# Patient Record
Sex: Male | Born: 1970 | Race: White | Hispanic: No | Marital: Married | Smoking: Never smoker
Health system: Southern US, Community
[De-identification: ages and names within clinical notes are randomized; demographics above are authoritative.]

## PROBLEM LIST (undated history)

## (undated) DIAGNOSIS — K219 Gastro-esophageal reflux disease without esophagitis: Secondary | ICD-10-CM

## (undated) DIAGNOSIS — J329 Chronic sinusitis, unspecified: Secondary | ICD-10-CM

## (undated) DIAGNOSIS — T7840XA Allergy, unspecified, initial encounter: Secondary | ICD-10-CM

## (undated) DIAGNOSIS — I10 Essential (primary) hypertension: Secondary | ICD-10-CM

## (undated) HISTORY — PX: COLONOSCOPY: SHX174

## (undated) HISTORY — DX: Essential (primary) hypertension: I10

## (undated) HISTORY — PX: CLAVICLE SURGERY: SHX598

## (undated) HISTORY — DX: Gastro-esophageal reflux disease without esophagitis: K21.9

## (undated) HISTORY — PX: UPPER GASTROINTESTINAL ENDOSCOPY: SHX188

## (undated) HISTORY — PX: BICEPS TENDON REPAIR: SHX566

## (undated) HISTORY — PX: FEMUR FRACTURE SURGERY: SHX633

## (undated) HISTORY — PX: MOUTH SURGERY: SHX715

## (undated) HISTORY — DX: Allergy, unspecified, initial encounter: T78.40XA

## (undated) HISTORY — PX: CARPAL TUNNEL RELEASE: SHX101

## (undated) HISTORY — PX: FOREARM FRACTURE SURGERY: SHX649

## (undated) HISTORY — DX: Chronic sinusitis, unspecified: J32.9

---

## 2003-08-18 ENCOUNTER — Ambulatory Visit (HOSPITAL_BASED_OUTPATIENT_CLINIC_OR_DEPARTMENT_OTHER): Admission: RE | Admit: 2003-08-18 | Discharge: 2003-08-18 | Payer: Self-pay | Admitting: Orthopedic Surgery

## 2014-02-01 ENCOUNTER — Institutional Professional Consult (permissible substitution): Payer: Self-pay | Admitting: Internal Medicine

## 2014-02-02 ENCOUNTER — Institutional Professional Consult (permissible substitution): Payer: Self-pay | Admitting: Internal Medicine

## 2014-03-15 ENCOUNTER — Institutional Professional Consult (permissible substitution): Payer: Self-pay | Admitting: Critical Care Medicine

## 2014-03-22 ENCOUNTER — Institutional Professional Consult (permissible substitution) (INDEPENDENT_AMBULATORY_CARE_PROVIDER_SITE_OTHER): Payer: Self-pay | Admitting: Critical Care Medicine

## 2014-03-22 DIAGNOSIS — R05 Cough: Secondary | ICD-10-CM

## 2019-11-12 ENCOUNTER — Encounter: Payer: Self-pay | Admitting: Gastroenterology

## 2019-11-15 DIAGNOSIS — I10 Essential (primary) hypertension: Secondary | ICD-10-CM | POA: Insufficient documentation

## 2019-11-15 DIAGNOSIS — E6609 Other obesity due to excess calories: Secondary | ICD-10-CM | POA: Insufficient documentation

## 2019-11-19 DIAGNOSIS — I10 Essential (primary) hypertension: Secondary | ICD-10-CM

## 2020-01-03 ENCOUNTER — Ambulatory Visit: Payer: Managed Care, Other (non HMO) | Admitting: Gastroenterology

## 2020-01-03 ENCOUNTER — Encounter: Payer: Self-pay | Admitting: Gastroenterology

## 2020-01-03 VITALS — BP 124/72 | HR 95 | Ht 68.0 in | Wt 205.0 lb

## 2020-01-03 DIAGNOSIS — R05 Cough: Secondary | ICD-10-CM | POA: Diagnosis not present

## 2020-01-03 DIAGNOSIS — Z1211 Encounter for screening for malignant neoplasm of colon: Secondary | ICD-10-CM | POA: Diagnosis not present

## 2020-01-03 DIAGNOSIS — R053 Chronic cough: Secondary | ICD-10-CM

## 2020-01-03 MED ORDER — PLENVU 140 G PO SOLR
ORAL | 0 refills | Status: DC
Start: 2020-01-03 — End: 2020-01-12
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
## 2020-01-03 NOTE — Patient Instructions (Addendum)
If you are age 49 or older, your body mass index should be between 23-30. Your Body mass index is 31.17 kg/m. If this is out of the aforementioned range listed, please consider follow up with your Primary Care Provider.  If you are age 73 or younger, your body mass index should be between 19-25. Your Body mass index is 31.17 kg/m. If this is out of the aformentioned range listed, please consider follow up with your Primary Care Provider.   You have been scheduled for an endoscopy and colonoscopy. Please follow the written instructions given to you at your visit today. Please pick up your prep supplies at the pharmacy within the next 1-3 days. If you use inhalers (even only as needed), please bring them with you on the day of your procedure.  It was a pleasure to see you today!  Dr. Loletha Carrow

## 2020-01-10 ENCOUNTER — Ambulatory Visit (INDEPENDENT_AMBULATORY_CARE_PROVIDER_SITE_OTHER): Payer: Managed Care, Other (non HMO)

## 2020-01-10 ENCOUNTER — Other Ambulatory Visit: Payer: Self-pay | Admitting: Gastroenterology

## 2020-01-10 ENCOUNTER — Other Ambulatory Visit: Payer: Self-pay

## 2020-01-10 DIAGNOSIS — Z1159 Encounter for screening for other viral diseases: Secondary | ICD-10-CM

## 2020-01-11 LAB — SARS CORONAVIRUS 2 (TAT 6-24 HRS): SARS Coronavirus 2: NEGATIVE

## 2020-01-12 ENCOUNTER — Other Ambulatory Visit: Payer: Self-pay

## 2020-01-12 ENCOUNTER — Ambulatory Visit (AMBULATORY_SURGERY_CENTER): Payer: Managed Care, Other (non HMO) | Admitting: Gastroenterology

## 2020-01-12 ENCOUNTER — Encounter: Payer: Self-pay | Admitting: Gastroenterology

## 2020-01-12 VITALS — BP 124/87 | HR 118 | Temp 97.5°F | Resp 16 | Ht 68.0 in | Wt 205.0 lb

## 2020-01-12 DIAGNOSIS — R05 Cough: Secondary | ICD-10-CM | POA: Diagnosis present

## 2020-01-12 DIAGNOSIS — K2100 Gastro-esophageal reflux disease with esophagitis, without bleeding: Secondary | ICD-10-CM

## 2020-01-12 DIAGNOSIS — R053 Chronic cough: Secondary | ICD-10-CM

## 2020-01-12 DIAGNOSIS — Z1211 Encounter for screening for malignant neoplasm of colon: Secondary | ICD-10-CM | POA: Diagnosis not present

## 2020-01-12 DIAGNOSIS — K621 Rectal polyp: Secondary | ICD-10-CM

## 2020-01-12 DIAGNOSIS — D128 Benign neoplasm of rectum: Secondary | ICD-10-CM

## 2020-01-12 MED ORDER — SODIUM CHLORIDE 0.9 % IV SOLN
500.0000 mL | Freq: Once | INTRAVENOUS | Status: DC
Start: 2020-01-12 — End: 2020-01-12

## 2020-01-12 NOTE — Progress Notes (Signed)
HR > 100 with esmolol 25 mg given IV, MD updated, vss  Report given to PACU, vss

## 2020-01-12 NOTE — Progress Notes (Signed)
VS-CW 

## 2020-01-12 NOTE — Patient Instructions (Signed)
Information on hiatal hernia and Esophagitis given to you today  An upper GI series will be scheduled by Dr Loletha Carrow' office  An Esophageal Manometry will be scheduled by Dr Loletha Carrow' office  Handout on polyps given to you today  Await pathology results on polyps removed   YOU HAD AN ENDOSCOPIC PROCEDURE TODAY AT [facility]:   Refer to the procedure report that was given to you for any specific questions about what was found during the examination.  If the procedure report does not answer your questions, please call your gastroenterologist to clarify.  If you requested that your care partner not be given the details of your procedure findings, then the procedure report has been included in a sealed envelope for you to review at your convenience later.  YOU SHOULD EXPECT: Some feelings of bloating in the abdomen. Passage of more gas than usual.  Walking can help get rid of the air that was put into your GI tract during the procedure and reduce the bloating. If you had a lower endoscopy (such as a colonoscopy or flexible sigmoidoscopy) you may notice spotting of blood in your stool or on the toilet paper. If you underwent a bowel prep for your procedure, you may not have a normal bowel movement for a few days.  Please Note:  You might notice some irritation and congestion in your nose or some drainage.  This is from the oxygen used during your procedure.  There is no need for concern and it should clear up in a day or so.  SYMPTOMS TO REPORT IMMEDIATELY:   Following lower endoscopy (colonoscopy or flexible sigmoidoscopy):  Excessive amounts of blood in the stool  Significant tenderness or worsening of abdominal pains  Swelling of the abdomen that is new, acute  Fever of 100F or higher   Following upper endoscopy (EGD)  Vomiting of blood or coffee ground material  New chest pain or pain under the shoulder blades  Painful or persistently difficult swallowing  New shortness  of breath  Fever of 100F or higher  Black, tarry-looking stools  For urgent or emergent issues, a gastroenterologist can be reached at any hour by calling [phone]. Do not use MyChart messaging for urgent concerns.    DIET:  We do recommend a small meal at first, but then you may proceed to your regular diet.  Drink plenty of fluids but you should avoid alcoholic beverages for 24 hours.  ACTIVITY:  You should plan to take it easy for the rest of today and you should NOT DRIVE or use heavy machinery until tomorrow (because of the sedation medicines used during the test).    FOLLOW UP: Our staff will call the number listed on your records 48-72 hours following your procedure to check on you and address any questions or concerns that you may have regarding the information given to you following your procedure. If we do not reach you, we will leave a message.  We will attempt to reach you two times.  During this call, we will ask if you have developed any symptoms of COVID 19. If you develop any symptoms (ie: fever, flu-like symptoms, shortness of breath, cough etc.) before then, please call [phone].  If you test positive for Covid 19 in the 2 weeks post procedure, please call and report this information to [place].    If any biopsies were taken you will be contacted by phone or by letter within the next 1-3 weeks.  Please call us at [phone] if you have not heard about the biopsies in 3 weeks.    SIGNATURES/CONFIDENTIALITY: You and/or your care partner have signed paperwork which will be entered into your electronic medical record.  These signatures attest to the fact that that the information above on your After Visit Summary has been reviewed and is understood.  Full responsibility of the confidentiality of this discharge information lies with you and/or your care-partner.

## 2020-01-12 NOTE — Progress Notes (Signed)
Called to room to assist during endoscopic procedure.  Patient ID and intended procedure confirmed with present staff. Received instructions for my participation in the procedure from the performing physician.  

## 2020-01-12 NOTE — Op Note (Signed)
[facility] Patient Name: Haston Casebolt Procedure Date: 01/12/2020 2:45 PM MRN: [id number] Endoscopist: Mallie Mussel L. Loletha Carrow , MD Age: 49 Referring MD:  Date of Birth: 1970/06/22 Gender: Male Account #: [id number] Procedure:                Upper GI endoscopy Indications:              Chronic cough, Regurgitation (years, prior EGD                            outside clinic, no improvement with prior PPI) - ?                            GERD as cause of cough Medicines:                Monitored Anesthesia Care Procedure:                Pre-Anesthesia Assessment:                           - Prior to the procedure, a History and Physical                            was performed, and patient medications and                            allergies were reviewed. The patient's tolerance of                            previous anesthesia was also reviewed. The risks                            and benefits of the procedure and the sedation                            options and risks were discussed with the patient.                            All questions were answered, and informed consent                            was obtained. Prior Anticoagulants: The patient has                            taken no previous anticoagulant or antiplatelet                            agents. ASA Grade Assessment: II - A patient with                            mild systemic disease. After reviewing the risks                            and benefits, the patient was deemed in  satisfactory condition to undergo the procedure.                           After obtaining informed consent, the endoscope was                            passed under direct vision. Throughout the                            procedure, the patient's blood pressure, pulse, and                            oxygen saturations were monitored continuously. The                            Endoscope was introduced through  the mouth, and                            advanced to the second part of duodenum. The upper                            GI endoscopy was accomplished without difficulty.                            The patient tolerated the procedure with coughing                            during and after. Scope In: Scope Out: Findings:                 The larynx was normal.                           A 4 cm hiatal hernia was present. (4cm axial, 3cm                            transverse)                           LA Grade B (one or more mucosal breaks greater than                            5 mm, not extending between the tops of two mucosal                            folds) esophagitis was found at the                            gastroesophageal junction. The distal esophagus was                            tortuous as a result of the hiatla hernia.                           During esophageal examination, while  cough and                            belch occured, the proximal stomach was seen                            retrolapsing into the distal esophagus.                           The stomach was normal.                           The cardia and gastric fundus were normal on                            retroflexion.                           The examined duodenum was normal. Complications:            No immediate complications. Estimated Blood Loss:     Estimated blood loss: none. Impression:               - Normal larynx.                           - 4 cm hiatal hernia.                           - LA Grade B esophagitis.                           - Normal stomach.                           - Normal examined duodenum.                           - No specimens collected.                           Endoscopic evidence of GERD with hiatal hernia most                            likely leading to cough reflex. 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 Recommendation:           - Patient has a contact number available for                            emergencies. The signs and symptoms of potential  delayed complications were discussed with the                            patient. Return to normal activities tomorrow.                            Written discharge instructions were provided to the                            patient.                           - Resume previous diet.                           - Continue present medications.                           - Do an upper GI series at appointment to be                            scheduled.                           - Perform routine esophageal manometry at                            appointment to be scheduled. Gerardine Peltz L. Loletha Carrow, MD 01/12/2020 3:44:38 PM This report has been signed electronically.

## 2020-01-12 NOTE — Progress Notes (Signed)
Robinul 0.1 mg IV given due large amount of secretions upon assessment.  MD made aware, vss 

## 2020-01-12 NOTE — Op Note (Signed)
[facility] Patient Name: Jesse Tyler Procedure Date: 01/12/2020 2:46 PM MRN: [id number] Endoscopist: [place]. Loletha Carrow , MD Age: 49 Referring MD:  Date of Birth: 01-19-1971 Gender: Male Account #: [id number] Procedure:                Colonoscopy Indications:              Screening for colorectal malignant neoplasm, This                            is the patient's first colonoscopy Medicines:                Monitored Anesthesia Care Procedure:                Pre-Anesthesia Assessment:                           - Prior to the procedure, a History and Physical                            was performed, and patient medications and                            allergies were reviewed. The patient's tolerance of                            previous anesthesia was also reviewed. The risks                            and benefits of the procedure and the sedation                            options and risks were discussed with the patient.                            All questions were answered, and informed consent                            was obtained. Prior Anticoagulants: The patient has                            taken no previous anticoagulant or antiplatelet                            agents. ASA Grade Assessment: II - A patient with                            mild systemic disease. After reviewing the risks                            and benefits, the patient was deemed in                            satisfactory condition to undergo the procedure.  After obtaining informed consent, the colonoscope                            was passed under direct vision. Throughout the                            procedure, the patient's blood pressure, pulse, and                            oxygen saturations were monitored continuously. The                            Colonoscope was introduced through the anus and                            advanced to the the cecum,  identified by                            appendiceal orifice and ileocecal valve. The                            colonoscopy was performed without difficulty. The                            patient tolerated the procedure well. The quality                            of the bowel preparation was excellent. The                            ileocecal valve, appendiceal orifice, and rectum                            were photographed. The bowel preparation used was                            Plenvu. Scope In: 2:55:16 PM Scope Out: 3:12:56 PM Scope Withdrawal Time: 0 hours 14 minutes 11 seconds  Total Procedure Duration: 0 hours 17 minutes 40 seconds  Findings:                 The perianal and digital rectal examinations were                            normal.                           Three sessile polyps were found in the rectum. The                            polyps were diminutive in size. These polyps were                            removed with a cold biopsy forceps. Resection and  retrieval were complete.                           The exam was otherwise without abnormality on                            direct and retroflexion views. Complications:            No immediate complications. Estimated Blood Loss:     Estimated blood loss was minimal. Impression:               - Three diminutive polyps in the rectum, removed                            with a cold biopsy forceps. Resected and retrieved.                           - The examination was otherwise normal on direct                            and retroflexion views. Recommendation:           - Patient has a contact number available for                            emergencies. The signs and symptoms of potential                            delayed complications were discussed with the                            patient. Return to normal activities tomorrow.                            Written discharge  instructions were provided to the                            patient.                           - Resume previous diet.                           - Continue present medications.                           - Await pathology results.                           - Repeat colonoscopy is recommended for                            surveillance. The colonoscopy date will be                            determined after pathology results from today's  exam become available for review.                           - See the other procedure note for documentation of                            additional recommendations. Balbina Depace L. Loletha Carrow, MD 01/12/2020 3:29:43 PM This report has been signed electronically.

## 2020-01-13 ENCOUNTER — Telehealth: Payer: Self-pay

## 2020-01-13 ENCOUNTER — Other Ambulatory Visit: Payer: Self-pay

## 2020-01-13 DIAGNOSIS — R1310 Dysphagia, unspecified: Secondary | ICD-10-CM

## 2020-01-13 DIAGNOSIS — R05 Cough: Secondary | ICD-10-CM

## 2020-01-13 DIAGNOSIS — R053 Chronic cough: Secondary | ICD-10-CM

## 2020-01-13 NOTE — Telephone Encounter (Signed)
Pt scheduled for Upper GI at [facility] 01/25/20@[facility]  01/25/20@9 :30am, pt to arrive there at 9:15am. Pt to be NPO after midnight. Pt knows we will call him back with the EM appt once scheduled.

## 2020-01-14 ENCOUNTER — Telehealth: Payer: Self-pay

## 2020-01-14 NOTE — Telephone Encounter (Signed)
LVM

## 2020-01-17 ENCOUNTER — Encounter: Payer: Self-pay | Admitting: Gastroenterology

## 2020-01-19 ENCOUNTER — Other Ambulatory Visit: Payer: Self-pay

## 2020-01-19 ENCOUNTER — Telehealth: Payer: Self-pay

## 2020-01-19 DIAGNOSIS — R1310 Dysphagia, unspecified: Secondary | ICD-10-CM

## 2020-01-19 NOTE — Telephone Encounter (Signed)
-----   Message from Algernon Huxley, RN sent at 01/13/2020  4:24 PM EDT ----- Regarding: EM Pt needs EM scheduled-Danis

## 2020-01-19 NOTE — Telephone Encounter (Signed)
Pt scheduled for covid screen 04/04/20@8 :30am, EM scheduled at [facility] 04/16/20@10 :30am. Pt aware of appts and instructions mailed to pt.

## 2020-01-25 ENCOUNTER — Ambulatory Visit (HOSPITAL_COMMUNITY)
Admission: RE | Admit: 2020-01-25 | Discharge: 2020-01-25 | Disposition: A | Discharge status: Home / Self Care | Payer: Managed Care, Other (non HMO) | Source: Ambulatory Visit | Attending: Gastroenterology | Admitting: Gastroenterology

## 2020-01-25 ENCOUNTER — Other Ambulatory Visit: Payer: Self-pay

## 2020-01-25 DIAGNOSIS — R1310 Dysphagia, unspecified: Secondary | ICD-10-CM | POA: Diagnosis present

## 2020-01-25 DIAGNOSIS — R053 Chronic cough: Secondary | ICD-10-CM

## 2020-01-25 DIAGNOSIS — R05 Cough: Secondary | ICD-10-CM | POA: Diagnosis not present

## 2020-04-04 ENCOUNTER — Other Ambulatory Visit (HOSPITAL_COMMUNITY): Payer: Managed Care, Other (non HMO)

## 2020-04-06 ENCOUNTER — Telehealth: Payer: Self-pay | Admitting: Gastroenterology

## 2020-04-06 NOTE — Telephone Encounter (Signed)
Lm on vm for patient to return call.  Esophageal manometry has been rescheduled to next available date which is 05/15/20 at 10:30 AM, arrival time 10 AM.  COVID Test has been rescheduled to 05/11/20 at 9:10 AM.  Will mail letter with update appointment information.

## 2020-04-06 NOTE — Telephone Encounter (Signed)
Pt is requesting to reschedule his EGD scheduled for tomorrow 11/12 at the hospital

## 2020-04-06 NOTE — Telephone Encounter (Signed)
Spoke with patient, he states that he needs a January appt since that is when his insurance will begin.   Rescheduled for 06/13/19 at 8:30 AM, arrival time 8 AM.   COVID test is scheduled for 06/09/19 at 9:30 AM.   Will mail letter. Patient is aware of new appointment information.

## 2020-05-11 ENCOUNTER — Other Ambulatory Visit (HOSPITAL_COMMUNITY): Payer: Managed Care, Other (non HMO)

## 2020-06-08 ENCOUNTER — Other Ambulatory Visit (HOSPITAL_COMMUNITY): Payer: Managed Care, Other (non HMO)

## 2020-06-08 ENCOUNTER — Telehealth: Payer: Self-pay

## 2020-06-08 NOTE — Telephone Encounter (Signed)
Received call from patient, he states that he was unable to make his appointment for his COVID test today for esophogeal manometry that is scheduled for Monday, 06/12/2020. Patient states that he would like to reschedule esophageal manometry as well because that time would not work. Advised that the procedure is only done on certain days and times. Advised that I will reschedule and call him back with the information. Patient verbalized understanding and had no concerns at the end of the call.

## 2020-06-08 NOTE — Telephone Encounter (Signed)
Patient's esophageal manometry has been rescheduled to Wednesday, 07/12/2020 at 12:30 PM. COVID test has been scheduled for Saturday, 07/08/2020 at 11:30 AM. Spoke with patient in regards to new appointment information, pt is aware that I will send updated appointment information in the mail - patient confirmed address on file. Advised that if he does not receive the letter in about 2 weeks or so to give [place] a call back. Patient verbalized understanding of all information and had no concerns at the end of the call.

## 2020-07-08 ENCOUNTER — Other Ambulatory Visit (HOSPITAL_COMMUNITY)
Admission: RE | Admit: 2020-07-08 | Discharge: 2020-07-08 | Disposition: A | Discharge status: Home / Self Care | Payer: BC Managed Care – PPO | Source: Ambulatory Visit | Attending: Gastroenterology | Admitting: Gastroenterology

## 2020-07-08 DIAGNOSIS — U071 COVID-19: Secondary | ICD-10-CM | POA: Diagnosis not present

## 2020-07-08 LAB — SARS CORONAVIRUS 2 (TAT 6-24 HRS): SARS Coronavirus 2: POSITIVE — AB

## 2020-07-09 ENCOUNTER — Telehealth: Payer: Self-pay | Admitting: Gastroenterology

## 2020-07-09 ENCOUNTER — Telehealth: Payer: Self-pay | Admitting: Family

## 2020-07-09 NOTE — Telephone Encounter (Signed)
Received a call from hospital regarding this patients COVID test, which was POSITIVE for manometry scheduled this week. I called the patient and no answer, I left him a message with results. I called his wife's phone, no answered. I will call back later to make sure he received this message.   [place] can you please cancel this patient's manometry test and contact him for rescheduling. Dr. Loletha Carrow patient. Thanks

## 2020-07-09 NOTE — Telephone Encounter (Addendum)
Called to discuss with Jesse Tyler about Covid symptoms and the use of casirivimab/imdevimab, a combination monoclonal antibody infusion for those with mild to moderate Covid symptoms and at a high risk of hospitalization.    Advised my roll and the purpose of my call. Pt does not qualify for infusion therapy as he has asymptomatic infection. He is  states he has no symptoms and that he believes he has a false positive. He advises he had Covid in September and felt bad at that time.     Patient Active Problem List   Diagnosis Date Noted  . Class 1 obesity due to excess calories without serious comorbidity with body mass index (BMI) of 32.0 to 32.9 in adult 11/15/2019  . Essential hypertension 11/15/2019    Lean Fayson,NP

## 2020-07-09 NOTE — Telephone Encounter (Signed)
Patient called back. Verified he received the message. He is asymptomatic. States he had symptomatic COVID in Sept and thinks it is a false positive. Regardless I told him to take precautions / isolation, and that we can call to reschedule his procedure no sooner than 10 days after his positive test.    Herbert Seta can you please contact this patient next week, needs to have manometry rescheduled. Should not need to be retested for COVID for this. Thanks

## 2020-07-10 NOTE — Telephone Encounter (Signed)
Spoke with patient, he wanted to reschedule esophageal manometry for the next available date. He has been scheduled for Wednesday, 07/19/20 at 8:30 AM, arrival time is 8 AM. No repeat COVID test necessary. Patient is aware of the appt, advised that I will mail him a letter with updated appt times, advised that instructions are the same only the date and time of procedure are changing. Patient verbalized understanding of this information and had no concerns at the end of the call.

## 2020-07-18 NOTE — Progress Notes (Signed)
Called patient regarding manometry appointment tomorrow and patient stated he would have to reschedule the appointment. I told him to call dr. Loletha Carrow' office to reschedule.

## 2020-07-19 ENCOUNTER — Encounter (HOSPITAL_COMMUNITY): Admission: RE | Payer: Self-pay | Source: Home / Self Care

## 2020-07-19 ENCOUNTER — Ambulatory Visit (HOSPITAL_COMMUNITY)
Admission: RE | Admit: 2020-07-19 | Payer: BC Managed Care – PPO | Source: Home / Self Care | Admitting: Gastroenterology

## 2020-07-19 SURGERY — MANOMETRY, ESOPHAGUS
Anesthesia: LOCAL

## 2021-01-17 ENCOUNTER — Telehealth: Payer: Self-pay | Admitting: Gastroenterology

## 2021-01-17 NOTE — Telephone Encounter (Signed)
Pt needs to r/s esophageal manometry. Pls call him.

## 2021-01-18 NOTE — Telephone Encounter (Signed)
Spoke with patient, his esophageal manometry has been rescheduled to Wednesday, 02/07/21 at 12:30 pm, arriving at 12 pm. Patient is aware that I will mail the instructions to his home address, pt confirmed address on file. Pt verbalized understanding and had no concerns at the end of the call.  CASE ID: [id number]

## 2021-01-29 IMAGING — RF DG UGI W SINGLE CM
14 of 23 series · 14 of 23 positions shown · non-contrast
Comparison: None.

CLINICAL DATA: Hiatal hernia.  Vomiting.  Chronic cough.

EXAM:
UPPER GI SERIES WITH KUB
TECHNIQUE: After obtaining a scout radiograph a routine upper GI series was
performed using thin and high density barium.
FLUOROSCOPY TIME:  Fluoroscopy Time:  2 minutes 18 seconds
Radiation Exposure Index (if provided by the fluoroscopic device):
61.1 mGy
Number of Acquired Spot Images: 0

[Series 1: t abdomen supine · 0.15mm/px · 1 of 1 slices shown]
[im 1/1]
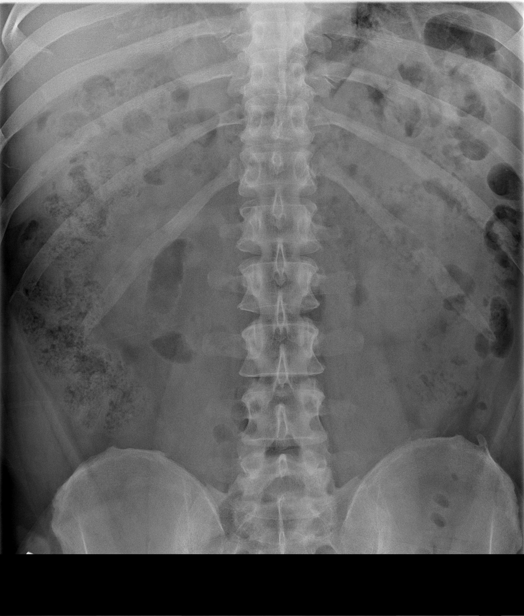

[Series 3: cp_standard · 0.26mm/px · 1 of 1 slices shown (1 of 13)]
[im 1/1]
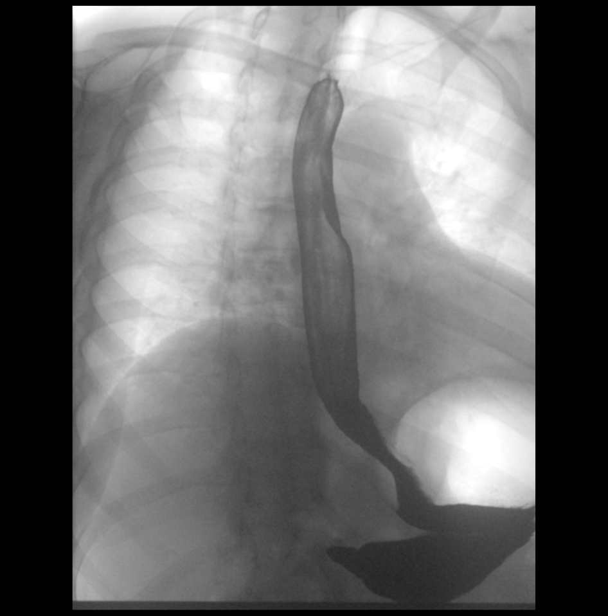

[Series 5: cp_standard · 0.26mm/px · 1 of 1 slices shown (2 of 13)]
[im 1/1]
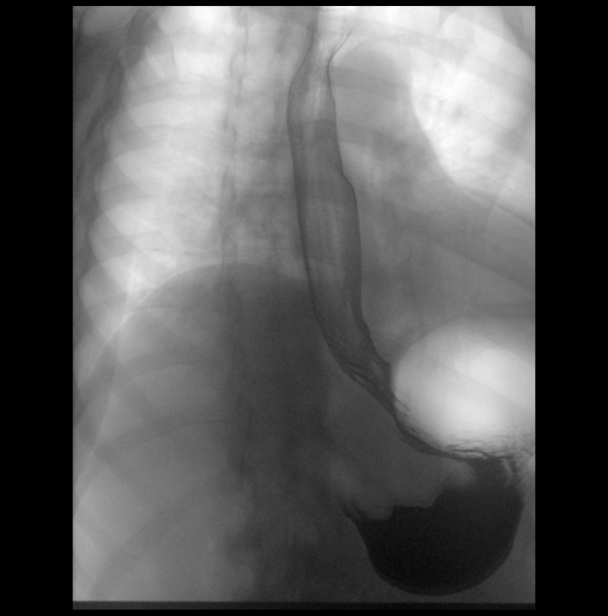

[Series 6: cp_standard · 0.18mm/px · 1 of 1 slices shown (3 of 13)]
[im 1/1]
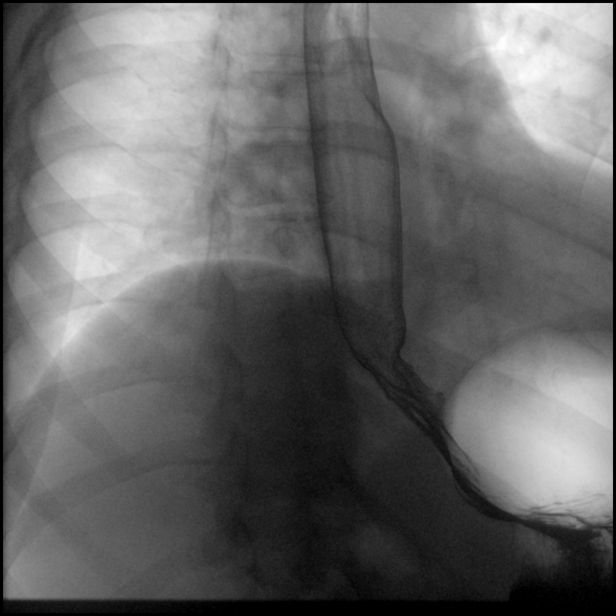

[Series 8: cp_standard · 0.18mm/px · 1 of 1 slices shown (4 of 13)]
[im 1/1]
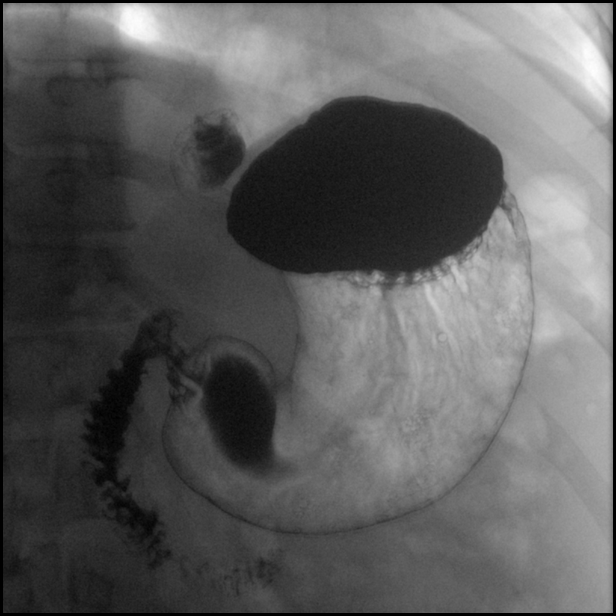

[Series 10: cp_standard · 0.18mm/px · 1 of 1 slices shown (5 of 13)]
[im 1/1]
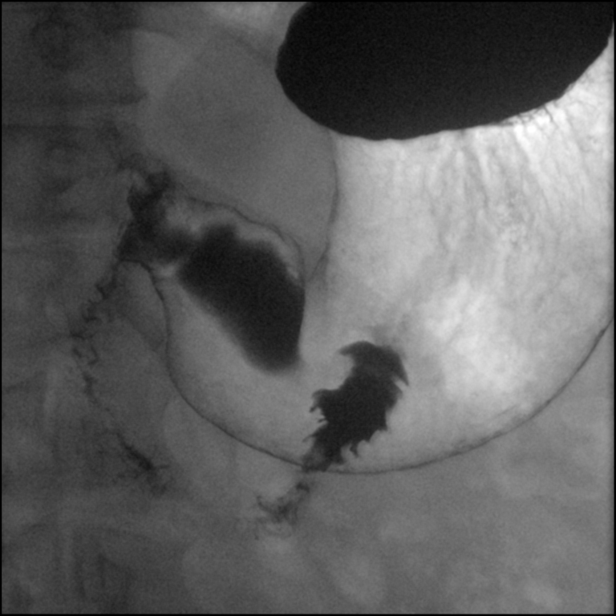

[Series 11: cp_standard · 0.18mm/px · 1 of 1 slices shown (6 of 13)]
[im 1/1]
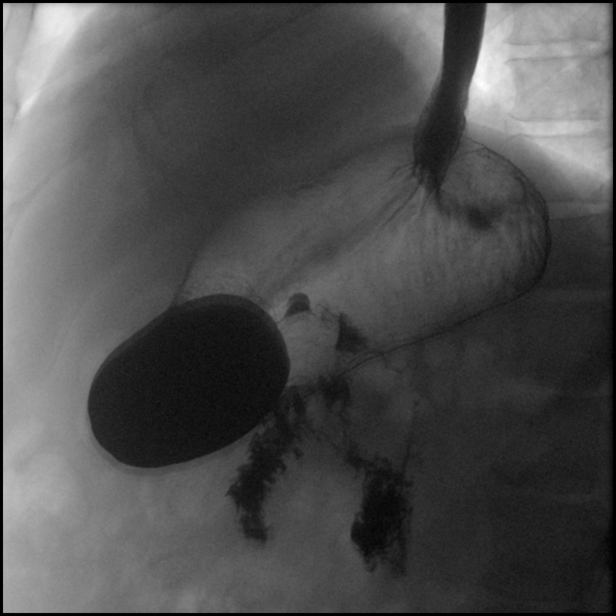

[Series 13: cp_standard · 0.17mm/px · 1 of 1 slices shown (7 of 13)]
[im 1/1]
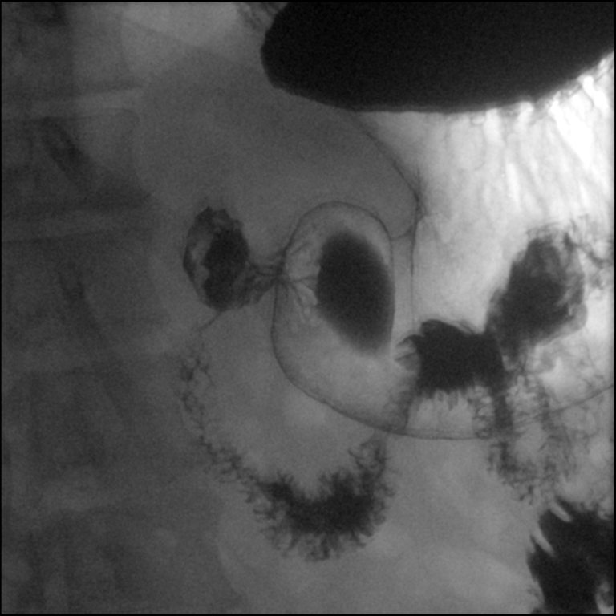

[Series 14: cp_standard · 0.17mm/px · 1 of 1 slices shown (8 of 13)]
[im 1/1]
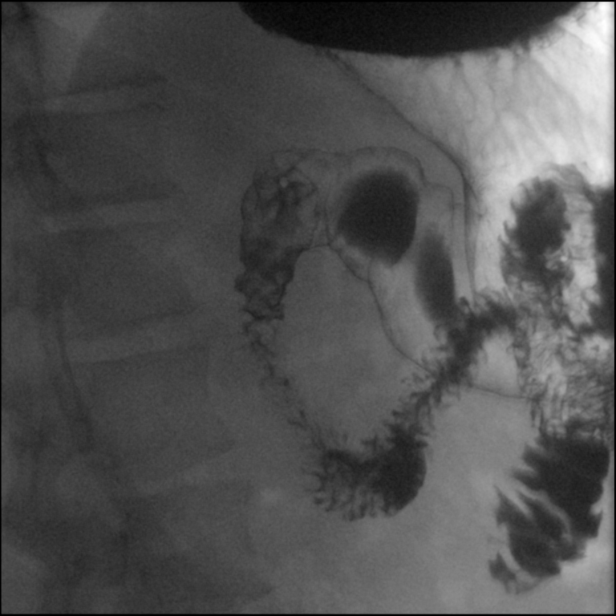

[Series 16: cp_standard · 0.28mm/px · 1 of 1 slices shown (9 of 13)]
[im 1/1]
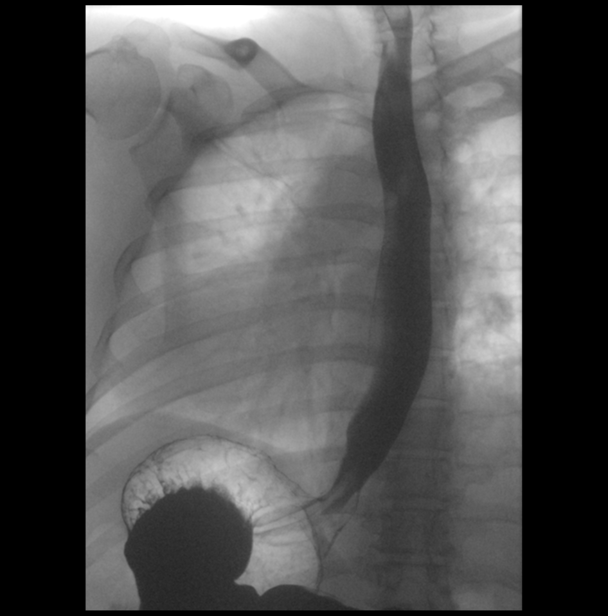

[Series 18: cp_standard · 0.18mm/px · 1 of 1 slices shown (10 of 13)]
[im 1/1]
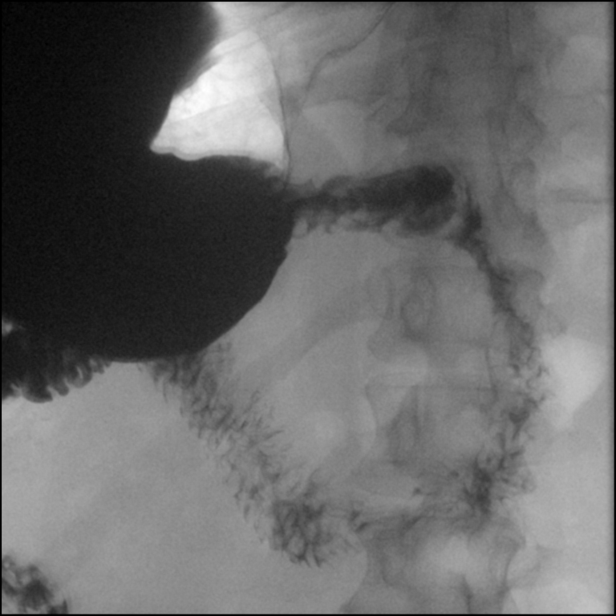

[Series 19: cp_standard · 0.18mm/px · 1 of 1 slices shown (11 of 13)]
[im 1/1]
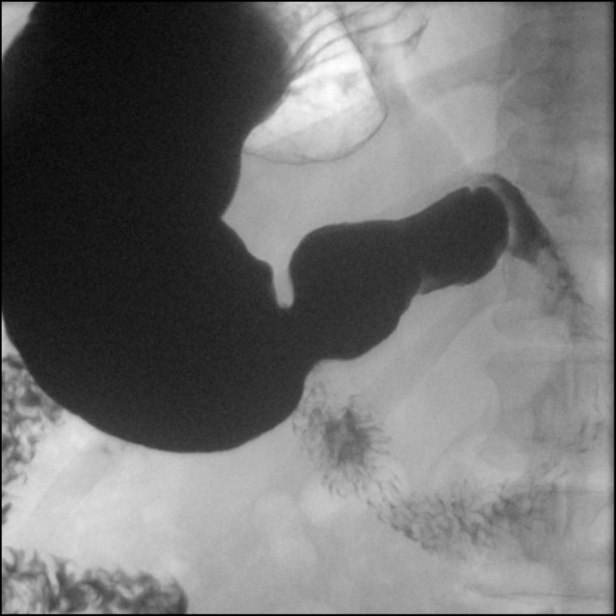

[Series 21: cp_standard · 0.18mm/px · 1 of 1 slices shown (12 of 13)]
[im 1/1]
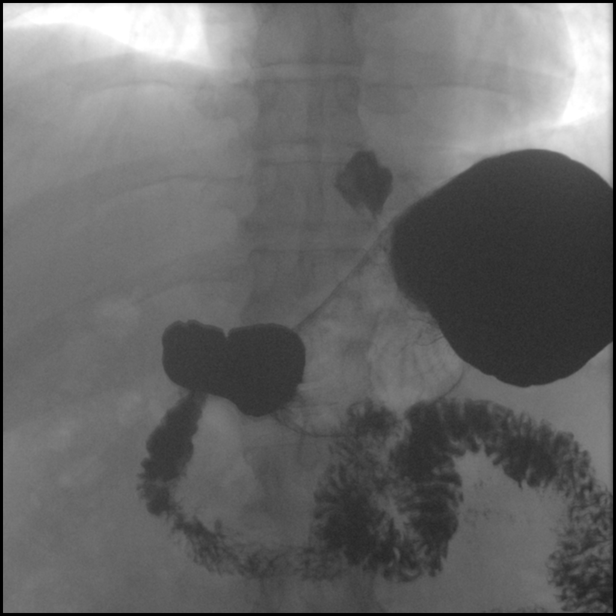

[Series 23: cp_standard · 0.18mm/px · 1 of 1 slices shown (13 of 13)]
[im 1/1]
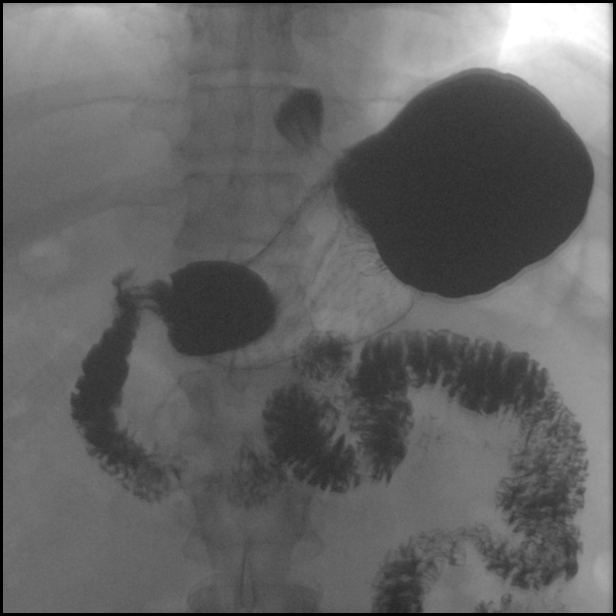

[14 of 23 positions shown; findings below may reference images not displayed]

FINDINGS: Scout radiograph:  Unremarkable bowel gas pattern.

Esophagus: No evidence of esophageal mass or stricture. Mild fold
thickening is seen in the distal thoracic esophagus, consistent with
esophagitis. Esophageal motility is within normal limits. Mild
gastroesophageal reflux was seen to the level of the distal thoracic
esophagus.

Stomach: Small sliding hiatal hernia is seen. Otherwise normal
appearance of stomach. No evidence of gastric mass or ulcer.

Duodenum: No ulcer or other significant abnormality seen involving
duodenal bulb or sweep.

Other:  None.
IMPRESSION: Small sliding hiatal hernia.

Mild gastroesophageal reflux.

Mild esophagitis involving the distal thoracic esophagus. No
evidence of esophageal stricture.

## 2021-02-07 ENCOUNTER — Ambulatory Visit (HOSPITAL_COMMUNITY)
Admission: RE | Admit: 2021-02-07 | Discharge: 2021-02-07 | Disposition: A | Discharge status: Home / Self Care | Payer: Managed Care, Other (non HMO) | Source: Ambulatory Visit | Attending: Gastroenterology | Admitting: Gastroenterology

## 2021-02-07 ENCOUNTER — Encounter (HOSPITAL_COMMUNITY)
Admission: RE | Disposition: A | Discharge status: Home / Self Care | Payer: Self-pay | Source: Ambulatory Visit | Attending: Gastroenterology

## 2021-02-07 DIAGNOSIS — R1310 Dysphagia, unspecified: Secondary | ICD-10-CM | POA: Insufficient documentation

## 2021-02-07 DIAGNOSIS — K449 Diaphragmatic hernia without obstruction or gangrene: Secondary | ICD-10-CM | POA: Insufficient documentation

## 2021-02-07 DIAGNOSIS — R1319 Other dysphagia: Secondary | ICD-10-CM | POA: Diagnosis not present

## 2021-02-07 DIAGNOSIS — K224 Dyskinesia of esophagus: Secondary | ICD-10-CM | POA: Insufficient documentation

## 2021-02-07 HISTORY — PX: ESOPHAGEAL MANOMETRY: SHX5429

## 2021-02-07 SURGERY — MANOMETRY, ESOPHAGUS
Anesthesia: LOCAL

## 2021-02-07 MED ORDER — LIDOCAINE VISCOUS HCL 2 % MT SOLN
OROMUCOSAL | Status: AC
  Filled 2021-02-07: qty 15

## 2021-02-07 SURGICAL SUPPLY — 2 items
FACESHIELD LNG OPTICON STERILE (SAFETY) IMPLANT
GLOVE BIO SURGEON STRL SZ8 (GLOVE) ×4 IMPLANT

## 2021-02-07 NOTE — Progress Notes (Signed)
Esophageal Manometry done per protocol. Patient tolerated well without distress or complication. Manometry probe removed per protocol. Pt vomited for several minutes after probe insertion. He said this is one of his symptoms. He was able to finally tolerate the probe to be able to do the test. Tolerated well.

## 2021-02-08 ENCOUNTER — Encounter (HOSPITAL_COMMUNITY): Payer: Self-pay | Admitting: Gastroenterology

## 2021-02-22 DIAGNOSIS — R1319 Other dysphagia: Secondary | ICD-10-CM

## 2021-02-26 ENCOUNTER — Telehealth: Payer: Self-pay | Admitting: Gastroenterology

## 2021-02-26 NOTE — Telephone Encounter (Signed)
error 

## 2021-03-05 ENCOUNTER — Encounter: Payer: Self-pay | Admitting: Gastroenterology

## 2021-03-05 ENCOUNTER — Ambulatory Visit (INDEPENDENT_AMBULATORY_CARE_PROVIDER_SITE_OTHER): Payer: Managed Care, Other (non HMO) | Admitting: Gastroenterology

## 2021-03-05 VITALS — BP 128/96 | HR 72 | Ht 66.5 in | Wt 205.0 lb

## 2021-03-05 DIAGNOSIS — K224 Dyskinesia of esophagus: Secondary | ICD-10-CM

## 2021-03-05 DIAGNOSIS — R053 Chronic cough: Secondary | ICD-10-CM | POA: Diagnosis not present

## 2021-03-05 DIAGNOSIS — K449 Diaphragmatic hernia without obstruction or gangrene: Secondary | ICD-10-CM

## 2021-03-05 DIAGNOSIS — K2100 Gastro-esophageal reflux disease with esophagitis, without bleeding: Secondary | ICD-10-CM

## 2021-03-05 NOTE — Progress Notes (Signed)
[facility] Progress Note  Chief Complaint: GERD and chronic cough  Subjective  History:  Jesse Tyler follows up for his symptoms, which are much the same as before.  He has a chronic cough that he feels is triggered by regurgitation episodes.  If he does not eat all day long, then his cough stops.  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  He denies dysphagia or odynophagia. August 2021 upper endoscopy with results noted below, recommendation was for an esophageal manometry and consideration of surgical referral for hiatal hernia.  Patient delayed getting that done until recently.  Colonoscopy August 2021 -3 diminutive rectal hyperplastic polyps, 10-year recall recommended. ROS: Cardiovascular:  no chest pain Respiratory: no dyspnea.  Cough sometimes productive of new Remainder of systems negative except as above The patient's Past Medical, Family and Social History were reviewed and are on file in the EMR. Past Medical History:  Diagnosis Date   Allergy    Chronic sinusitis    GERD (gastroesophageal reflux disease)    HTN (hypertension)     Objective:  Med list reviewed  Current Outpatient Medications:    fluticasone (FLONASE) 50 MCG/ACT nasal spray, Place 1 spray into both nostrils as needed., Disp: , Rfl:    hydrochlorothiazide (HYDRODIURIL) 25 MG tablet, Take 1 tablet by mouth daily., Disp: , Rfl:    KRILL OIL PO, Take by mouth. 1200mg  daily, Disp: , Rfl:    losartan (COZAAR) 100 MG tablet, Take 1 tablet by mouth daily., Disp: , Rfl:    Vital signs in last 24 hrs: Vitals:   03/05/21 1403  BP: (!) 128/96  Pulse: 72   Wt Readings from Last 3 Encounters:  03/05/21 205 lb (93 kg)  01/12/20 205 lb (93 kg)  01/03/20 205 lb (93 kg)    Physical Exam  Well-appearing, normal vocal quality HEENT: sclera anicteric, oral mucosa moist without lesions Neck: supple, no thyromegaly,  JVD or lymphadenopathy Cardiac: RRR without murmurs, S1S2 heard, no peripheral edema Pulm: clear to auscultation bilaterally, normal RR and effort noted Abdomen: soft, no tenderness, with active bowel sounds. No guarding or palpable hepatosplenomegaly.   Data:  CLINICAL DATA:  Hiatal hernia.  Vomiting.  Chronic cough.   EXAM: UPPER GI SERIES WITH KUB   TECHNIQUE: After obtaining a scout radiograph a routine upper GI series was performed using thin and high density barium.   FLUOROSCOPY TIME:  Fluoroscopy Time:  2 minutes 18 seconds   Radiation Exposure Index (if provided by the fluoroscopic device): 61.1 mGy   Number of Acquired Spot Images: 0   COMPARISON:  None.   FINDINGS: Scout radiograph:  Unremarkable bowel gas pattern.   Esophagus: No evidence of esophageal mass or stricture. Mild fold thickening is seen in the distal thoracic esophagus, consistent with esophagitis. Esophageal motility is within normal limits. Mild gastroesophageal reflux was seen to the level of the distal thoracic esophagus.   Stomach: Small sliding hiatal hernia is seen. Otherwise normal appearance of stomach. No evidence of gastric mass or ulcer.   Duodenum: No ulcer or other significant abnormality seen involving duodenal bulb or sweep.   Other:  None.   IMPRESSION: Small sliding hiatal hernia.   Mild gastroesophageal reflux.   Mild esophagitis involving the distal thoracic esophagus. No evidence of esophageal stricture.     Electronically Signed   By: Marlaine Hind M.D.   On:  01/25/2020 10:10    ___________________________________________ Radiologic studies:  Esophageal manometry study 02/07/2021  Normal resting EG junction pressure with complete relaxation after swallowing. Esophageal contractions were 50% peristaltic, with normal distal latency and normal DCI.  50% failed with absent peristalsis. Manometric evidence of hiatal hernia Normal basal and residual upper  esophageal pressures.  Pression normal relaxation of EG junction, ineffective esophageal motility.  (My clinical impression with these findings are related to GERD) ____________________________________________ Other: The larynx was normal. - A 4 cm hiatal hernia was present. (4cm axial, 3cm transverse) - LA Grade B (one or more mucosal breaks greater than 5 mm, not extending between the tops of two mucosal folds) esophagitis was found at the gastroesophageal junction. The distal esophagus was tortuous as a result of the hiatla hernia. During esophageal examination, while cough and belch occured, the proximal stomach was seen retrolapsing into the distal esophagus. - The stomach was normal. - The cardia and gastric fundus were norm  _____________________________________________ Assessment & Plan  Assessment: Encounter Diagnoses  Name Primary?   Gastroesophageal reflux disease with esophagitis without hemorrhage Yes   Hiatal hernia    Chronic cough    Esophageal dysmotility    At least 15 years of chronic cough and regurgitation, which are most likely related to each other.  Based on endoscopic and radiographic findings, he has significant GERD.  The dysmotility seen on recent manometry is likely the result of GERD.  It does not sound like he has seen a pulmonary physician in the past, no formal work-up for his chronic cough is near as I can determine  Plan: Pulmonary clinic evaluation to see if patient might have reactive airway or structural airway disease to account for his cough.  Referral to Dr. Greer Pickerel at [facility] for consideration of hiatal hernia repair and fundoplication for reflux.  Based on the manometry results, he would need a loose wrap, which I explained to him. Jesse Tyler no longer takes acid suppression therapy since he never really found it very helpful. 30 minutes were spent on this encounter (including chart review, history/exam, counseling/coordination of care, and  documentation) > 50% of that time was spent on counseling and coordination of care.   Nelida Meuse III

## 2021-03-05 NOTE — Patient Instructions (Addendum)
If you are age 50 or older, your body mass index should be between 23-30. Your Body mass index is 32.59 kg/m. If this is out of the aforementioned range listed, please consider follow up with your Primary Care Provider.  If you are age 22 or younger, your body mass index should be between 19-25. Your Body mass index is 32.59 kg/m. If this is out of the aformentioned range listed, please consider follow up with your Primary Care Provider.   __________________________________________________________  The [facility] providers would like to encourage you to use [facility] to communicate with providers for non-urgent requests or questions.  Due to long hold times on the telephone, sending your provider a message by [facility] may be a faster and more efficient way to get a response.  Please allow 48 business hours for a response.  Please remember that this is for non-urgent requests.   We will send your records to [facility]  [address]  [address] [place] [address] [phone]    We will send your records to [facility]   [address], [place], [place] [address] Phone: [phone]  It was a pleasure to see you today!  Thank you for trusting me with your gastrointestinal care!       It was a pleasure to see you today!  Thank you for trusting me with your gastrointestinal care!

## 2021-03-07 ENCOUNTER — Telehealth: Payer: Self-pay

## 2021-03-07 ENCOUNTER — Other Ambulatory Visit: Payer: Self-pay

## 2021-03-07 DIAGNOSIS — K2100 Gastro-esophageal reflux disease with esophagitis, without bleeding: Secondary | ICD-10-CM

## 2021-03-07 DIAGNOSIS — R053 Chronic cough: Secondary | ICD-10-CM

## 2021-03-07 NOTE — Telephone Encounter (Signed)
Referral to pulmonary for chronic cough  Referral to [facility] for h.hernia eval   Will await appointment info

## 2021-03-13 NOTE — Telephone Encounter (Signed)
I have spoken to Mamou @ [facility] who indicates that patient information has been entered in the system but has not yet been scheduled.

## 2021-03-22 ENCOUNTER — Other Ambulatory Visit: Payer: Self-pay

## 2021-03-22 ENCOUNTER — Encounter: Payer: Self-pay | Admitting: Internal Medicine

## 2021-03-22 ENCOUNTER — Ambulatory Visit (INDEPENDENT_AMBULATORY_CARE_PROVIDER_SITE_OTHER): Payer: Managed Care, Other (non HMO) | Admitting: Internal Medicine

## 2021-03-22 VITALS — BP 142/76 | HR 77 | Temp 99.0°F | Ht 68.0 in | Wt 204.8 lb

## 2021-03-22 DIAGNOSIS — K449 Diaphragmatic hernia without obstruction or gangrene: Secondary | ICD-10-CM | POA: Diagnosis not present

## 2021-03-22 DIAGNOSIS — R053 Chronic cough: Secondary | ICD-10-CM
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
## 2021-03-22 NOTE — Patient Instructions (Addendum)
ICD-10-CM   1. Chronic cough  R05.3 CT Chest High Resolution    Pulmonary function test    Nitric oxide    CBC with Differential/Platelet    IgE    IgE    CBC with Differential/Platelet    2. Hiatal hernia  K44.9       Likely the bad hiatal hernia is causing all of your problems but need to rule out other  causes such as asthma or pulmonary fibrosis  I think krill oil might make acid reflux worse   Plan  - stop krill oil if you can  - do HRCT supine and prone  - do full PFT - do FeNO - do cbc with diff, blood IgE  Followup  - Nurse practitioner in few weeks to review results ahead of possible surgery

## 2021-03-23 LAB — CBC WITH DIFFERENTIAL/PLATELET
Basophils Absolute: 0 10*3/uL (ref 0.0–0.1)
Basophils Relative: 0.6 % (ref 0.0–3.0)
Eosinophils Absolute: 0.3 10*3/uL (ref 0.0–0.7)
Eosinophils Relative: 3.7 % (ref 0.0–5.0)
HCT: 46.4 % (ref 39.0–52.0)
Hemoglobin: 15.4 g/dL (ref 13.0–17.0)
Lymphocytes Relative: 29.6 % (ref 12.0–46.0)
Lymphs Abs: 2.4 10*3/uL (ref 0.7–4.0)
MCHC: 33.1 g/dL (ref 30.0–36.0)
MCV: 87.8 fl (ref 78.0–100.0)
Monocytes Absolute: 0.8 10*3/uL (ref 0.1–1.0)
Monocytes Relative: 10.4 % (ref 3.0–12.0)
Neutro Abs: 4.5 10*3/uL (ref 1.4–7.7)
Neutrophils Relative %: 55.7 % (ref 43.0–77.0)
Platelets: 266 10*3/uL (ref 150.0–400.0)
RBC: 5.28 Mil/uL (ref 4.22–5.81)
RDW: 14 % (ref 11.5–15.5)
WBC: 8.1 10*3/uL (ref 4.0–10.5)

## 2021-03-23 LAB — IGE: IgE (Immunoglobulin E), Serum: 15 kU/L (ref <=114)

## 2021-03-23 NOTE — Telephone Encounter (Signed)
Patient has been scheduled for 03-30-2021 @ 130 pm at [facility]   Patient was seen by pulmonary on 03-22-2021

## 2021-05-01 ENCOUNTER — Other Ambulatory Visit: Payer: Self-pay

## 2021-05-01 ENCOUNTER — Ambulatory Visit (INDEPENDENT_AMBULATORY_CARE_PROVIDER_SITE_OTHER)
Admission: RE | Admit: 2021-05-01 | Discharge: 2021-05-01 | Disposition: A | Discharge status: Home / Self Care | Payer: Managed Care, Other (non HMO) | Source: Ambulatory Visit | Attending: Internal Medicine | Admitting: Internal Medicine

## 2021-05-01 DIAGNOSIS — R053 Chronic cough: Secondary | ICD-10-CM

## 2021-05-07 ENCOUNTER — Other Ambulatory Visit: Payer: Self-pay

## 2021-05-07 ENCOUNTER — Ambulatory Visit: Payer: Managed Care, Other (non HMO) | Admitting: Adult Health

## 2021-06-15 ENCOUNTER — Encounter: Payer: Self-pay | Admitting: Adult Health

## 2021-06-15 ENCOUNTER — Other Ambulatory Visit: Payer: Self-pay

## 2021-06-15 ENCOUNTER — Ambulatory Visit (INDEPENDENT_AMBULATORY_CARE_PROVIDER_SITE_OTHER): Payer: Managed Care, Other (non HMO) | Admitting: Adult Health

## 2021-06-15 ENCOUNTER — Ambulatory Visit (INDEPENDENT_AMBULATORY_CARE_PROVIDER_SITE_OTHER): Payer: Managed Care, Other (non HMO) | Admitting: Internal Medicine

## 2021-06-15 DIAGNOSIS — R053 Chronic cough: Secondary | ICD-10-CM | POA: Diagnosis not present

## 2021-06-15 LAB — PULMONARY FUNCTION TEST
DL/VA % pred: 75 %
DL/VA: 3.39 ml/min/mmHg/L
DLCO cor % pred: 72 %
DLCO cor: 20.08 ml/min/mmHg
DLCO unc % pred: 72 %
DLCO unc: 20.08 ml/min/mmHg
FEF 25-75 Post: 3.58 L/sec
FEF 25-75 Pre: 2.93 L/sec
FEF2575-%Change-Post: 22 %
FEF2575-%Pred-Post: 108 %
FEF2575-%Pred-Pre: 89 %
FEV1-%Change-Post: 5 %
FEV1-%Pred-Post: 95 %
FEV1-%Pred-Pre: 90 %
FEV1-Post: 3.5 L
FEV1-Pre: 3.31 L
FEV1FVC-%Change-Post: 4 %
FEV1FVC-%Pred-Pre: 99 %
FEV6-%Change-Post: 0 %
FEV6-%Pred-Post: 93 %
FEV6-%Pred-Pre: 93 %
FEV6-Post: 4.28 L
FEV6-Pre: 4.27 L
FEV6FVC-%Change-Post: 0 %
FEV6FVC-%Pred-Post: 103 %
FEV6FVC-%Pred-Pre: 102 %
FVC-%Change-Post: 1 %
FVC-%Pred-Post: 92 %
FVC-%Pred-Pre: 90 %
FVC-Post: 4.36 L
FVC-Pre: 4.3 L
Post FEV1/FVC ratio: 80 %
Post FEV6/FVC ratio: 100 %
Pre FEV1/FVC ratio: 77 %
Pre FEV6/FVC Ratio: 99 %
RV % pred: 121 %
RV: 2.34 L
TLC % pred: 103 %
TLC: 6.78 L

## 2021-06-15 MED ORDER — BENZONATATE 200 MG PO CAPS: 200.0000 mg | ORAL_CAPSULE | Freq: Three times a day (TID) | ORAL | 1 refills | Status: AC | PRN

## 2021-06-15 MED ORDER — ALBUTEROL SULFATE HFA 108 (90 BASE) MCG/ACT IN AERS: 1.0000 | INHALATION_SPRAY | Freq: Four times a day (QID) | RESPIRATORY_TRACT | 1 refills | Status: AC | PRN

## 2021-06-15 NOTE — Assessment & Plan Note (Signed)
Chronic cough x18 years.  Suspect he has a upper airway cough syndrome.  Probably aggravated by reflux and chronic rhinitis.  High-resolution CT chest was negative for ILD.  IgE was normal.  Mildly elevated eosinophils at 300.  PFTs are essentially normal with no airflow obstruction or restriction.  Slightly diminished DLCO. Patient does have a known hiatal hernia.  Patient is currently not taking anything for cough.  We will begin GERD and chronic rhinitis treatment along with cough suppression.  Plan  Patient Instructions  Begin Delsym 2 tsp Twice daily  for cough , As needed   Begin Tessalon Three times a day  for cough , as needed  Begin Pepcid 20mg  Twice daily   Begin Zyrtec 10mg   daily in am  Begin Chlorpheniramine 4mg  At bedtime   Sips of water to soothe throat and prevent coughing .  NO MINTS  Stop Krill oil .  Albuterol inhaler As needed   Follow up with Dr. Chase Caller or Thos Matsumoto NP in 4-6 weeks and As needed   Please contact office for sooner follow up if symptoms do not improve or worsen or seek emergency care

## 2021-06-15 NOTE — Progress Notes (Signed)
Full PFT completed today ? ?

## 2021-06-15 NOTE — Progress Notes (Signed)
@Patient  ID: Jesse Tyler, male    DOB: January 28, 1971, 51 y.o.   MRN: [id number]  Chief Complaint  Patient presents with   Follow-up    Referring provider: Madison Hickman, FNP  HPI: 51 year old male never smoker seen for pulmonary consult March 22, 2021 for chronic cough x18 years.  TEST/EVENTS :   06/15/2021 Follow up ; Chronic cough  Patient returns for a 34-month follow-up.  Patient was seen last visit for pulmonary consult for chronic cough over the last 18 years.  Patient was set up for pulmonary function testing that was done today that shows normal lung function with no airflow obstruction or restriction.  FEV1 was 95%, ratio 80, FVC 92%, no significant bronchodilator response, slightly decreased DLCO at 72%. High-resolution CT chest done on May 01, 2021 showed no evidence of interstitial lung disease.  Scattered small pulmonary nodules measuring 1 to 4 mm in size most consistent with a benign etiology. Exhaled nitric oxide testing today was normal at 18. Lab work showed IgE at 15, absolute eosinophil count at 300 Since last visit patient is feeling no change, continue to have chronic cough .  Cough is worse with drinking /eating . Constant throat clearing . Dry cough . 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   Being evaluated by surgery for hiatal hernia surgery .      Allergies  Allergen Reactions   Codeine Nausea And Vomiting     There is no immunization history on file for this patient.  Past Medical History:  Diagnosis Date   Allergy    Chronic sinusitis    GERD (gastroesophageal reflux disease)    HTN (hypertension)     Tobacco History: Social History   Tobacco Use  Smoking Status Never  Smokeless Tobacco Never   Counseling given: Not Answered   Outpatient Medications Prior to Visit  Medication Sig Dispense Refill   fluticasone (FLONASE) 50 MCG/ACT nasal spray Place 1 spray into both nostrils as needed.     KRILL OIL  PO Take by mouth. 1200mg  daily     losartan (COZAAR) 100 MG tablet Take 1 tablet by mouth daily.     metoprolol succinate (TOPROL-XL) 50 MG 24 hr tablet Take 50 mg by mouth daily. Take with or immediately following a meal.     No facility-administered medications prior to visit.     Review of Systems:   Constitutional:   No  weight loss, night sweats,  Fevers, chills, fatigue, or  lassitude.  HEENT:   No headaches,  Difficulty swallowing,  Tooth/dental problems, or  Sore throat,                No sneezing, itching, ear ache,  +nasal congestion, post nasal drip,   CV:  No chest pain,  Orthopnea, PND, swelling in lower extremities, anasarca, dizziness, palpitations, syncope.   GI  No heartburn, indigestion, abdominal pain, nausea, vomiting, diarrhea, change in bowel habits, loss of appetite, bloody stools.   Resp: No shortness of breath with exertion or at rest.  No excess mucus, no productive cough,  .  No chest wall deformity  Skin: no rash or lesions.  GU: no dysuria, change in color of urine, no urgency or frequency.  No flank pain, no hematuria   MS:  No joint pain or swelling.  No decreased range of motion.  No back pain.    Physical Exam  BP 124/86 (BP Location: Left Arm, Patient Position: Sitting,  Cuff Size: Normal)    Pulse 65    Temp 98.7 F (37.1 C) (Oral)    Ht 5\' 8"  (1.727 m)    Wt 206 lb (93.4 kg)    SpO2 94%    BMI 31.32 kg/m   GEN: A/Ox3; pleasant , NAD, well nourished    HEENT:  [place]/AT,  EACs-clear, TMs-wnl, NOSE-clear, THROAT-clear, no lesions, no postnasal drip or exudate noted.   NECK:  Supple w/ fair ROM; no JVD; normal carotid impulses w/o bruits; no thyromegaly or nodules palpated; no lymphadenopathy.    RESP  Clear  P & A; w/o, wheezes/ rales/ or rhonchi. no accessory muscle use, no dullness to percussion  CARD:  RRR, no m/r/g, no peripheral edema, pulses intact, no cyanosis or clubbing.  GI:   Soft & nt; nml bowel sounds; no organomegaly or masses  detected.   Musco: Warm bil, no deformities or joint swelling noted.   Neuro: alert, no focal deficits noted.    Skin: Warm, no lesions or rashes    Lab Results:  CBC    Component Value Date/Time   WBC 8.1 03/22/2021 1634   RBC 5.28 03/22/2021 1634   HGB 15.4 03/22/2021 1634   HCT 46.4 03/22/2021 1634   PLT 266.0 03/22/2021 1634   MCV 87.8 03/22/2021 1634   MCHC 33.1 03/22/2021 1634   RDW 14.0 03/22/2021 1634   LYMPHSABS 2.4 03/22/2021 1634   MONOABS 0.8 03/22/2021 1634   EOSABS 0.3 03/22/2021 1634   BASOSABS 0.0 03/22/2021 1634    BMET No results found for: NA, K, CL, CO2, GLUCOSE, BUN, CREATININE, CALCIUM, GFRNONAA, GFRAA  BNP No results found for: BNP  ProBNP No results found for: PROBNP  Imaging: No results found.    PFT Results Latest Ref Rng & Units 06/15/2021  FVC-Pre L 4.30  FVC-Predicted Pre % 90  FVC-Post L 4.36  FVC-Predicted Post % 92  Pre FEV1/FVC % % 77  Post FEV1/FCV % % 80  FEV1-Pre L 3.31  FEV1-Predicted Pre % 90  FEV1-Post L 3.50  DLCO uncorrected ml/min/mmHg 20.08  DLCO UNC% % 72  DLCO corrected ml/min/mmHg 20.08  DLCO COR %Predicted % 72  DLVA Predicted % 75  TLC L 6.78  TLC % Predicted % 103  RV % Predicted % 121    No results found for: NITRICOXIDE      Assessment & Plan:   Chronic cough Chronic cough x18 years.  Suspect he has a upper airway cough syndrome.  Probably aggravated by reflux and chronic rhinitis.  High-resolution CT chest was negative for ILD.  IgE was normal.  Mildly elevated eosinophils at 300.  PFTs are essentially normal with no airflow obstruction or restriction.  Slightly diminished DLCO. Patient does have a known hiatal hernia.  Patient is currently not taking anything for cough.  We will begin GERD and chronic rhinitis treatment along with cough suppression.  Plan  Patient Instructions  Begin Delsym 2 tsp Twice daily  for cough , As needed   Begin Tessalon Three times a day  for cough , as needed   Begin Pepcid 20mg  Twice daily   Begin Zyrtec 10mg   daily in am  Begin Chlorpheniramine 4mg  At bedtime   Sips of water to soothe throat and prevent coughing .  NO MINTS  Stop Krill oil .  Albuterol inhaler As needed   Follow up with Dr. Chase Caller or Zuleica Seith NP in 4-6 weeks and As needed   Please contact office for sooner follow  up if symptoms do not improve or worsen or seek emergency care         Rexene Edison, NP 06/15/2021

## 2021-06-15 NOTE — Patient Instructions (Addendum)
Begin Delsym 2 tsp Twice daily  for cough , As needed   Begin Tessalon Three times a day  for cough , as needed  Begin Pepcid 20mg  Twice daily   Begin Zyrtec 10mg   daily in am  Begin Chlorpheniramine 4mg  At bedtime   Sips of water to soothe throat and prevent coughing .  NO MINTS  Stop Krill oil .  Albuterol inhaler As needed   Follow up with Dr. Chase Caller or Kinslee Dalpe NP in 4-6 weeks and As needed   Please contact office for sooner follow up if symptoms do not improve or worsen or seek emergency care

## 2021-07-31 ENCOUNTER — Ambulatory Visit: Payer: Managed Care, Other (non HMO) | Admitting: Internal Medicine

## 2021-09-17 ENCOUNTER — Ambulatory Visit (INDEPENDENT_AMBULATORY_CARE_PROVIDER_SITE_OTHER): Payer: Managed Care, Other (non HMO) | Admitting: Internal Medicine

## 2021-09-17 ENCOUNTER — Encounter: Payer: Self-pay | Admitting: Internal Medicine

## 2021-09-17 VITALS — BP 118/80 | HR 84 | Temp 98.1°F | Ht 68.0 in | Wt 202.8 lb

## 2021-09-17 DIAGNOSIS — K449 Diaphragmatic hernia without obstruction or gangrene: Secondary | ICD-10-CM

## 2021-09-17 DIAGNOSIS — R053 Chronic cough: Secondary | ICD-10-CM

## 2021-09-17 DIAGNOSIS — R0982 Postnasal drip: Secondary | ICD-10-CM | POA: Diagnosis not present
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
## 2021-09-17 NOTE — Patient Instructions (Addendum)
ICD-10-CM   ?1. Chronic cough  R05.3   ?  ?2. Hiatal hernia  K44.9   ?  ? ? ?Glad cough much better after stopping krill oil and following Tammy plan ?Ongoing hiatal heria is a risk facto for cough and as such needs correction ?No evidence of lung disease per se ? ?Plan ?- Contin Delsym 2 tsp Twice daily  for cough , As needed   ?- Continue Tessalon Three times a day  for cough , as needed  ?- Continue Pepcid '20mg'$  Twice daily   ?- Continue Zyrtec '10mg'$   daily in am and  Chlorpheniramine '4mg'$  At bedtime   ? - but can take this as needed ?- Sips of water to soothe throat and prevent coughing .  ?- NO MINTS  ?- No Krill oil  ?- Albuterol inhaler As needed   ? ?- Get Hiatal hernia surgery done ? ? ? ?Follow up  ?= with Dr. Chase Caller or [place] NP in 6 months or As needed   ? ? ?

## 2022-03-19 ENCOUNTER — Ambulatory Visit: Payer: Managed Care, Other (non HMO) | Admitting: Adult Health

## 2022-03-21 ENCOUNTER — Ambulatory Visit: Payer: Self-pay | Admitting: Adult Health

## 2022-03-26 ENCOUNTER — Ambulatory Visit: Payer: Self-pay | Admitting: Gastroenterology
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
## 2022-08-13 ENCOUNTER — Ambulatory Visit: Payer: Self-pay | Admitting: Gastroenterology

## 2022-08-30 ENCOUNTER — Other Ambulatory Visit (HOSPITAL_COMMUNITY): Payer: Self-pay | Admitting: General Surgery

## 2022-08-30 DIAGNOSIS — K449 Diaphragmatic hernia without obstruction or gangrene: Secondary | ICD-10-CM

## 2022-08-30 DIAGNOSIS — R053 Chronic cough: Secondary | ICD-10-CM

## 2022-09-13 ENCOUNTER — Other Ambulatory Visit (HOSPITAL_COMMUNITY): Payer: Self-pay

## 2022-09-13 ENCOUNTER — Encounter (HOSPITAL_COMMUNITY): Payer: Self-pay

## 2022-12-16 ENCOUNTER — Ambulatory Visit (HOSPITAL_COMMUNITY)
Admission: RE | Admit: 2022-12-16 | Discharge: 2022-12-16 | Disposition: A | Discharge status: Home / Self Care | Payer: Self-pay | Source: Ambulatory Visit | Attending: General Surgery | Admitting: General Surgery

## 2022-12-16 DIAGNOSIS — K449 Diaphragmatic hernia without obstruction or gangrene: Secondary | ICD-10-CM | POA: Insufficient documentation

## 2022-12-16 DIAGNOSIS — R053 Chronic cough: Secondary | ICD-10-CM | POA: Insufficient documentation
